# Patient Record
Sex: Male | Born: 1995
Health system: Southern US, Community
[De-identification: ages and names within clinical notes are randomized; demographics above are authoritative.]

---
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
## 2016-01-21 MED FILL — VENTOLIN HFA 90 MCG INHALER: 108 (90 BAS | 25 days supply | Qty: 18 | Fill #1

## 2016-02-09 MED FILL — ADVAIR 250/50 DISKUS: 250-50 | 90 days supply | Qty: 180 | Fill #2

## 2016-02-29 MED FILL — VENTOLIN HFA 90 MCG INHALER: 108 (90 BAS | 25 days supply | Qty: 18 | Fill #2

## 2016-06-09 DIAGNOSIS — R509 Fever, unspecified: Secondary | ICD-10-CM | POA: Diagnosis not present

## 2016-06-09 DIAGNOSIS — K5900 Constipation, unspecified: Secondary | ICD-10-CM | POA: Diagnosis not present

## 2016-06-22 MED FILL — VENTOLIN HFA 90 MCG INHALER: 108 (90 BAS | 25 days supply | Qty: 18 | Fill #0

## 2016-06-30 ENCOUNTER — Encounter (HOSPITAL_COMMUNITY): Payer: Self-pay | Admitting: Emergency Medicine

## 2016-06-30 ENCOUNTER — Ambulatory Visit (HOSPITAL_COMMUNITY)
Admission: EM | Admit: 2016-06-30 | Discharge: 2016-06-30 | Disposition: A | Discharge status: Home / Self Care | Payer: 59 | Attending: Family Medicine | Admitting: Family Medicine

## 2016-06-30 DIAGNOSIS — Z202 Contact with and (suspected) exposure to infections with a predominantly sexual mode of transmission: Secondary | ICD-10-CM | POA: Insufficient documentation

## 2016-06-30 DIAGNOSIS — N342 Other urethritis: Secondary | ICD-10-CM | POA: Diagnosis not present

## 2016-06-30 MED ORDER — AZITHROMYCIN 250 MG PO TABS
ORAL_TABLET | ORAL | Status: AC
  Filled 2016-06-30: qty 4

## 2016-06-30 MED ORDER — ACETAMINOPHEN 160 MG/5ML PO SUSP
ORAL | Status: AC
  Filled 2016-06-30: qty 5

## 2016-06-30 MED ORDER — CEFTRIAXONE SODIUM 250 MG IJ SOLR
INTRAMUSCULAR | Status: AC
  Filled 2016-06-30: qty 250

## 2016-06-30 MED ORDER — AZITHROMYCIN 250 MG PO TABS
1000.0000 mg | ORAL_TABLET | Freq: Once | ORAL | Status: AC
  Administered 2016-06-30: 1000 mg via ORAL

## 2016-06-30 MED ORDER — CEFTRIAXONE SODIUM 250 MG IJ SOLR
250.0000 mg | Freq: Once | INTRAMUSCULAR | Status: AC
  Administered 2016-06-30: 250 mg via INTRAMUSCULAR

## 2016-06-30 NOTE — ED Triage Notes (Signed)
Here for STD check... Had unprotected sex 1 week  Sx today include dysuria  Denies penile d/c  Also reports watery stools onset 5 days.   A&O x4... NAD

## 2016-06-30 NOTE — ED Provider Notes (Signed)
CSN: [id number]     Arrival date & time 06/30/16  1739 History   First MD Initiated Contact with Patient 06/30/16 1958     Chief Complaint  Patient presents with  . Exposure to STD   (Consider location/radiation/quality/duration/timing/severity/associated sxs/prior Treatment) Patient c/o penile discharge and has had recently had unprotected sex a week ago.   The history is provided by the patient.  Exposure to STD  This is a new problem. The current episode started more than 1 week ago. The problem occurs constantly. The problem has not changed since onset.The symptoms are aggravated by intercourse. He has tried nothing for the symptoms.    Past Medical History:  Diagnosis Date  . Asthma    History reviewed. No pertinent surgical history. History reviewed. No pertinent family history. Social History  Substance Use Topics  . Smoking status: Never Smoker  . Smokeless tobacco: Never Used  . Alcohol use No    Review of Systems  Constitutional: Negative.   HENT: Negative.   Eyes: Negative.   Respiratory: Negative.   Cardiovascular: Negative.   Gastrointestinal: Negative.   Endocrine: Negative.   Genitourinary: Positive for dysuria and penile pain.  Musculoskeletal: Negative.   Allergic/Immunologic: Negative.   Neurological: Negative.   Hematological: Negative.   Psychiatric/Behavioral: Negative.     Allergies  Patient has no known allergies.  Home Medications   Prior to Admission medications   Medication Sig Start Date End Date Taking? Authorizing Provider  fluticasone-salmeterol (ADVAIR HFA) 115-21 MCG/ACT inhaler Inhale 2 puffs into the lungs 2 (two) times daily.     Yes Historical Provider, MD   Meds Ordered and Administered this Visit   Medications  cefTRIAXone (ROCEPHIN) injection 250 mg (not administered)  azithromycin (ZITHROMAX) tablet 1,000 mg (1,000 mg Oral Given 06/30/16 2032)    BP 146/82 (BP Location: Left Arm)   Pulse 67   Temp 98.2 F (36.8 C)  (Oral)   Resp 16   SpO2 100%  No data found.   Physical Exam  Constitutional: He is oriented to person, place, and time. He appears well-developed and well-nourished.  HENT:  Head: Normocephalic and atraumatic.  Eyes: Conjunctivae and EOM are normal. Pupils are equal, round, and reactive to light.  Neck: Normal range of motion. Neck supple.  Pulmonary/Chest: Effort normal and breath sounds normal.  Abdominal: Soft. Bowel sounds are normal.  Genitourinary: Penile tenderness present.  Neurological: He is alert and oriented to person, place, and time.  Nursing note and vitals reviewed.   Urgent Care Course     Procedures (including critical care time)  Labs Review Labs Reviewed  URINE CYTOLOGY ANCILLARY ONLY    Imaging Review No results found.   Visual Acuity Review  Right Eye Distance:   Left Eye Distance:   Bilateral Distance:    Right Eye Near:   Left Eye Near:    Bilateral Near:         MDM   1. STD exposure   2. Possible exposure to STD   3. Urethritis    Azithromycin 250mg  x 4 Rocephin 250mg  IM  Urine Cytology GC chlamydia [place] Artistrich      Katriel Cutsforth J Keisuke Hollabaugh, FNP 06/30/16 2037

## 2016-07-03 LAB — URINE CYTOLOGY ANCILLARY ONLY
Chlamydia: NEGATIVE
Neisseria Gonorrhea: NEGATIVE
Trichomonas: NEGATIVE

## 2016-07-27 MED FILL — VENTOLIN HFA 90 MCG INHALER: 108 (90 BAS | 25 days supply | Qty: 18 | Fill #0

## 2016-09-22 MED FILL — VENTOLIN HFA 90 MCG INHALER: 108 (90 BAS | 25 days supply | Qty: 18 | Fill #1

## 2016-10-04 DIAGNOSIS — M533 Sacrococcygeal disorders, not elsewhere classified: Secondary | ICD-10-CM | POA: Diagnosis not present

## 2016-10-23 MED FILL — ADVAIR 250/50 DISKUS: 250-50 | 90 days supply | Qty: 180 | Fill #0

## 2016-10-23 MED FILL — VENTOLIN HFA 90 MCG INHALER: 108 (90 BAS | 17 days supply | Qty: 18 | Fill #0

## 2016-10-30 ENCOUNTER — Encounter (HOSPITAL_COMMUNITY): Payer: Self-pay | Admitting: Family Medicine

## 2016-10-30 ENCOUNTER — Ambulatory Visit (HOSPITAL_COMMUNITY)
Admission: EM | Admit: 2016-10-30 | Discharge: 2016-10-30 | Disposition: A | Discharge status: Home / Self Care | Payer: 59 | Attending: Family Medicine | Admitting: Family Medicine

## 2016-10-30 ENCOUNTER — Telehealth (HOSPITAL_COMMUNITY): Payer: Self-pay | Admitting: Emergency Medicine

## 2016-10-30 DIAGNOSIS — R05 Cough: Secondary | ICD-10-CM | POA: Diagnosis not present

## 2016-10-30 DIAGNOSIS — R062 Wheezing: Secondary | ICD-10-CM | POA: Diagnosis not present

## 2016-10-30 DIAGNOSIS — J4541 Moderate persistent asthma with (acute) exacerbation: Secondary | ICD-10-CM

## 2016-10-30 MED ORDER — PREDNISONE 20 MG PO TABS: ORAL_TABLET | ORAL | 0 refills | Status: AC

## 2016-10-30 MED ORDER — PREDNISONE 20 MG PO TABS: ORAL_TABLET | ORAL | 0 refills | Status: DC

## 2016-10-30 MED ORDER — PREDNISONE 20 MG PO TABS
ORAL_TABLET | ORAL | Status: AC
  Filled 2016-10-30: qty 3

## 2016-10-30 MED ORDER — PREDNISONE 20 MG PO TABS
20.0000 mg | ORAL_TABLET | Freq: Once | ORAL | Status: AC
  Administered 2016-10-30: 20 mg via ORAL

## 2016-10-30 MED ORDER — IPRATROPIUM-ALBUTEROL 0.5-2.5 (3) MG/3ML IN SOLN
3.0000 mL | Freq: Once | RESPIRATORY_TRACT | Status: AC
  Administered 2016-10-30: 3 mL via RESPIRATORY_TRACT

## 2016-10-30 MED ORDER — IPRATROPIUM-ALBUTEROL 0.5-2.5 (3) MG/3ML IN SOLN
RESPIRATORY_TRACT | Status: AC
  Filled 2016-10-30: qty 3

## 2016-10-30 MED FILL — predniSONE 20 MG TABS: 20 | 5 days supply | Qty: 10 | Fill #0

## 2016-10-30 NOTE — Telephone Encounter (Signed)
Patient [facility].

## 2016-10-30 NOTE — ED Triage Notes (Signed)
Pt  Has  Some  Wheezing   Cough    And  Congestion          With  Some   Shortness  Of  Breath       Not  releived  By  Her  otc  Inhalers  And  Neb  Treatment

## 2016-10-30 NOTE — Discharge Instructions (Signed)
You should be feeling much better by this evening. If your symptoms are getting worse again this evening, please come to the emergency room for follow-up

## 2016-10-30 NOTE — ED Provider Notes (Signed)
[facility]    CSN: [id number] Arrival date & time: 10/30/16  1432     History   Chief Complaint Chief Complaint  Patient presents with  . Cough    HPI Garrett Taylor is a 21 y.o. male.   Pt  Has  Some  Wheezing   Cough    And  Congestion          With  Some   Shortness  Of  Breath       Not  releived  By  Her  otc  Inhalers  And  Neb  Treatment          Past Medical History:  Diagnosis Date  . Asthma     There are no active problems to display for this patient.   History reviewed. No pertinent surgical history.     Home Medications    Prior to Admission medications   Medication Sig Start Date End Date Taking? Authorizing Provider  fluticasone-salmeterol (ADVAIR HFA) 115-21 MCG/ACT inhaler Inhale 2 puffs into the lungs 2 (two) times daily.      [provider]  predniSONE (DELTASONE) 20 MG tablet Two daily with food 10/30/16   Elvina SidleLauenstein, Adalyna Godbee, MD    Family History No family history on file.  Social History Social History  Substance Use Topics  . Smoking status: Never Smoker  . Smokeless tobacco: Never Used  . Alcohol use No     Allergies   Patient has no known allergies.   Review of Systems Review of Systems   Physical Exam Triage Vital Signs ED Triage Vitals  Enc Vitals Group     BP      Pulse      Resp      Temp      Temp src      SpO2      Weight      Height      Head Circumference      Peak Flow      Pain Score      Pain Loc      Pain Edu?      Excl. in GC?    No data found.   Updated Vital Signs BP 132/74   Pulse (!) 114   Temp 98.6 F (37 C) (Oral)   Resp 20   SpO2 97%    Physical Exam  Constitutional: He is oriented to person, place, and time. He appears well-developed and well-nourished.  HENT:  Right Ear: External ear normal.  Left Ear: External ear normal.  Mouth/Throat: Oropharynx is clear and moist.  Eyes: Conjunctivae and EOM are normal. Pupils are equal, round, and reactive to  light.  Neck: Normal range of motion. Neck supple.  Cardiovascular: Normal rate, regular rhythm and normal heart sounds.   Pulmonary/Chest: He has wheezes.  Prolonged exhalation phase  Musculoskeletal: Normal range of motion.  Neurological: He is alert and oriented to person, place, and time.  Skin: Skin is warm and dry.  Nursing note and vitals reviewed.    UC Treatments / Results  Labs (all labs ordered are listed, but only abnormal results are displayed) Labs Reviewed - No data to display  EKG  EKG Interpretation None       Radiology No results found.  Procedures Procedures (including critical care time)  Medications Ordered in UC Medications  ipratropium-albuterol (DUONEB) 0.5-2.5 (3) MG/3ML nebulizer solution 3 mL (3 mLs Nebulization Given 10/30/16 1556)  predniSONE (DELTASONE) tablet 20 mg (20  mg Oral Given 10/30/16 1554)     Initial Impression / Assessment and Plan / UC Course  I have reviewed the triage vital signs and the nursing notes.  Pertinent labs & imaging results that were available during my care of the patient were reviewed by me and considered in my medical decision making (see chart for details).   improved aeration after breathing treatment  Final Clinical Impressions(s) / UC Diagnoses   Final diagnoses:  Moderate persistent asthma with exacerbation    New Prescriptions New Prescriptions   PREDNISONE (DELTASONE) 20 MG TABLET    Two daily with food     Elvina Sidle, MD 10/30/16 [phone]

## 2016-11-17 MED FILL — VENTOLIN HFA 90 MCG INHALER: 108 (90 BAS | 25 days supply | Qty: 18 | Fill #2

## 2016-11-20 DIAGNOSIS — M25511 Pain in right shoulder: Secondary | ICD-10-CM | POA: Diagnosis not present

## 2017-02-23 MED FILL — VENTOLIN HFA 90 MCG INHALER: 108 (90 BAS | 25 days supply | Qty: 18 | Fill #1

## 2017-04-12 MED FILL — ADVAIR 250/50 DISKUS: 250-50 | 30 days supply | Qty: 60 | Fill #1

## 2017-04-12 MED FILL — VENTOLIN HFA 90 MCG INHALER: 108 (90 BAS | 25 days supply | Qty: 18 | Fill #2

## 2017-06-06 MED FILL — VENTOLIN HFA 90 MCG INHALER: 108 (90 BAS | 25 days supply | Qty: 18 | Fill #1

## 2017-06-15 MED FILL — ADVAIR 250/50 DISKUS: 250-50 | 30 days supply | Qty: 60 | Fill #2

## 2017-08-10 MED FILL — ADVAIR 250/50 DISKUS: 250-50 | 60 days supply | Qty: 120 | Fill #3

## 2017-08-10 MED FILL — VENTOLIN HFA 90 MCG INHALER: 108 (90 BAS | 25 days supply | Qty: 18 | Fill #0

## 2017-08-30 ENCOUNTER — Ambulatory Visit: Payer: Self-pay | Admitting: Podiatry

## 2017-10-01 MED FILL — VENTOLIN HFA 90 MCG INHALER: 108 (90 BAS | 25 days supply | Qty: 18 | Fill #1

## 2017-11-20 MED FILL — VENTOLIN HFA 90 MCG INHALER: 108 (90 BAS | 25 days supply | Qty: 18 | Fill #2

## 2017-12-20 MED FILL — VENTOLIN HFA 90 MCG INHALER: 108 (90 BAS | 16 days supply | Qty: 18 | Fill #0

## 2017-12-25 MED FILL — ADVAIR 250/50 DISKUS: 250-50 | 90 days supply | Qty: 180 | Fill #0

## 2018-01-18 MED FILL — VENTOLIN HFA 90 MCG INHALER: 108 (90 BAS | 16 days supply | Qty: 18 | Fill #1

## 2018-02-25 MED FILL — VENTOLIN HFA 90 MCG INHALER: 108 (90 BAS | 17 days supply | Qty: 18 | Fill #0

## 2018-07-01 DIAGNOSIS — J45909 Unspecified asthma, uncomplicated: Secondary | ICD-10-CM | POA: Diagnosis not present

## 2018-07-01 MED FILL — VENTOLIN HFA 90 MCG INHALER: 108 (90 BAS | 17 days supply | Qty: 18 | Fill #0

## 2018-07-01 MED FILL — ADVAIR 250/50 DISKUS: 250-50 | 90 days supply | Qty: 180 | Fill #1

## 2018-08-02 MED FILL — VENTOLIN HFA 90 MCG INHALER: 108 (90 BAS | 17 days supply | Qty: 18 | Fill #0

## 2018-08-16 DIAGNOSIS — Z23 Encounter for immunization: Secondary | ICD-10-CM | POA: Diagnosis not present

## 2018-08-16 DIAGNOSIS — Z0000 Encounter for general adult medical examination without abnormal findings: Secondary | ICD-10-CM | POA: Diagnosis not present

## 2018-10-17 ENCOUNTER — Emergency Department (HOSPITAL_COMMUNITY): Payer: 59

## 2018-10-17 ENCOUNTER — Emergency Department (HOSPITAL_COMMUNITY)
Admission: EM | Admit: 2018-10-17 | Discharge: 2018-10-17 | Disposition: A | Discharge status: Home / Self Care | Payer: 59 | Attending: Emergency Medicine | Admitting: Emergency Medicine

## 2018-10-17 ENCOUNTER — Encounter (HOSPITAL_COMMUNITY): Payer: Self-pay | Admitting: Emergency Medicine

## 2018-10-17 ENCOUNTER — Other Ambulatory Visit: Payer: Self-pay

## 2018-10-17 DIAGNOSIS — F10920 Alcohol use, unspecified with intoxication, uncomplicated: Secondary | ICD-10-CM

## 2018-10-17 DIAGNOSIS — Y9389 Activity, other specified: Secondary | ICD-10-CM | POA: Diagnosis not present

## 2018-10-17 DIAGNOSIS — Y999 Unspecified external cause status: Secondary | ICD-10-CM | POA: Insufficient documentation

## 2018-10-17 DIAGNOSIS — Z041 Encounter for examination and observation following transport accident: Secondary | ICD-10-CM | POA: Diagnosis not present

## 2018-10-17 DIAGNOSIS — J45909 Unspecified asthma, uncomplicated: Secondary | ICD-10-CM | POA: Diagnosis not present

## 2018-10-17 DIAGNOSIS — F10129 Alcohol abuse with intoxication, unspecified: Secondary | ICD-10-CM | POA: Diagnosis not present

## 2018-10-17 DIAGNOSIS — S199XXA Unspecified injury of neck, initial encounter: Secondary | ICD-10-CM | POA: Diagnosis not present

## 2018-10-17 DIAGNOSIS — Z79899 Other long term (current) drug therapy: Secondary | ICD-10-CM | POA: Diagnosis not present

## 2018-10-17 DIAGNOSIS — S91311A Laceration without foreign body, right foot, initial encounter: Secondary | ICD-10-CM

## 2018-10-17 DIAGNOSIS — S299XXA Unspecified injury of thorax, initial encounter: Secondary | ICD-10-CM | POA: Diagnosis not present

## 2018-10-17 DIAGNOSIS — F10929 Alcohol use, unspecified with intoxication, unspecified: Secondary | ICD-10-CM | POA: Diagnosis not present

## 2018-10-17 DIAGNOSIS — S61412A Laceration without foreign body of left hand, initial encounter: Secondary | ICD-10-CM | POA: Diagnosis not present

## 2018-10-17 DIAGNOSIS — S0990XA Unspecified injury of head, initial encounter: Secondary | ICD-10-CM | POA: Diagnosis not present

## 2018-10-17 DIAGNOSIS — S3991XA Unspecified injury of abdomen, initial encounter: Secondary | ICD-10-CM | POA: Diagnosis not present

## 2018-10-17 DIAGNOSIS — R0902 Hypoxemia: Secondary | ICD-10-CM | POA: Diagnosis not present

## 2018-10-17 DIAGNOSIS — S96921A Laceration of unspecified muscle and tendon at ankle and foot level, right foot, initial encounter: Secondary | ICD-10-CM | POA: Diagnosis not present

## 2018-10-17 DIAGNOSIS — S61012A Laceration without foreign body of left thumb without damage to nail, initial encounter: Secondary | ICD-10-CM | POA: Diagnosis not present

## 2018-10-17 DIAGNOSIS — Y92410 Unspecified street and highway as the place of occurrence of the external cause: Secondary | ICD-10-CM | POA: Diagnosis not present

## 2018-10-17 DIAGNOSIS — R404 Transient alteration of awareness: Secondary | ICD-10-CM | POA: Diagnosis not present

## 2018-10-17 DIAGNOSIS — S99921A Unspecified injury of right foot, initial encounter: Secondary | ICD-10-CM | POA: Diagnosis not present

## 2018-10-17 DIAGNOSIS — I959 Hypotension, unspecified: Secondary | ICD-10-CM | POA: Diagnosis not present

## 2018-10-17 DIAGNOSIS — I10 Essential (primary) hypertension: Secondary | ICD-10-CM | POA: Diagnosis not present

## 2018-10-17 LAB — CBC WITH DIFFERENTIAL/PLATELET
Abs Immature Granulocytes: 0.07 10*3/uL (ref 0.00–0.07)
Basophils Absolute: 0 10*3/uL (ref 0.0–0.1)
Basophils Relative: 1 %
Eosinophils Absolute: 0.2 10*3/uL (ref 0.0–0.5)
Eosinophils Relative: 4 %
HCT: 48.9 % (ref 39.0–52.0)
Hemoglobin: 16.3 g/dL (ref 13.0–17.0)
Immature Granulocytes: 1 %
Lymphocytes Relative: 32 %
Lymphs Abs: 1.8 10*3/uL (ref 0.7–4.0)
MCH: 31.8 pg (ref 26.0–34.0)
MCHC: 33.3 g/dL (ref 30.0–36.0)
MCV: 95.3 fL (ref 80.0–100.0)
Monocytes Absolute: 0.5 10*3/uL (ref 0.1–1.0)
Monocytes Relative: 9 %
Neutro Abs: 3.2 10*3/uL (ref 1.7–7.7)
Neutrophils Relative %: 53 %
Platelets: 236 10*3/uL (ref 150–400)
RBC: 5.13 MIL/uL (ref 4.22–5.81)
RDW: 11.4 % — ABNORMAL LOW (ref 11.5–15.5)
WBC: 5.9 10*3/uL (ref 4.0–10.5)
nRBC: 0 % (ref 0.0–0.2)

## 2018-10-17 LAB — URINALYSIS, ROUTINE W REFLEX MICROSCOPIC
Bilirubin Urine: NEGATIVE
Glucose, UA: NEGATIVE mg/dL
Ketones, ur: NEGATIVE mg/dL
Leukocytes,Ua: NEGATIVE
Nitrite: NEGATIVE
Protein, ur: NEGATIVE mg/dL
Specific Gravity, Urine: 1.003 — ABNORMAL LOW (ref 1.005–1.030)
pH: 6 (ref 5.0–8.0)

## 2018-10-17 LAB — RAPID URINE DRUG SCREEN, HOSP PERFORMED
Amphetamines: NOT DETECTED
Barbiturates: NOT DETECTED
Benzodiazepines: POSITIVE — AB
Cocaine: NOT DETECTED
Opiates: NOT DETECTED
Tetrahydrocannabinol: NOT DETECTED

## 2018-10-17 LAB — BASIC METABOLIC PANEL
Anion gap: 12 (ref 5–15)
BUN: 10 mg/dL (ref 6–20)
CO2: 21 mmol/L — ABNORMAL LOW (ref 22–32)
Calcium: 9.4 mg/dL (ref 8.9–10.3)
Chloride: 108 mmol/L (ref 98–111)
Creatinine, Ser: 1.15 mg/dL (ref 0.61–1.24)
GFR calc Af Amer: >60 mL/min (ref >60)
GFR calc non Af Amer: >60 mL/min (ref >60)
Glucose, Bld: 93 mg/dL (ref 70–99)
Potassium: 5.1 mmol/L (ref 3.5–5.1)
Sodium: 141 mmol/L (ref 135–145)

## 2018-10-17 LAB — ETHANOL: Alcohol, Ethyl (B): 198 mg/dL — ABNORMAL HIGH (ref <10)

## 2018-10-17 MED ORDER — IOHEXOL 300 MG/ML  SOLN
100.0000 mL | Freq: Once | INTRAMUSCULAR | Status: AC | PRN
  Administered 2018-10-17: 100 mL via INTRAVENOUS

## 2018-10-17 MED ORDER — CEPHALEXIN 500 MG PO CAPS: 500.0000 mg | ORAL_CAPSULE | Freq: Four times a day (QID) | ORAL | 0 refills | Status: AC

## 2018-10-17 MED ORDER — ONDANSETRON HCL 4 MG/2ML IJ SOLN
4.0000 mg | Freq: Once | INTRAMUSCULAR | Status: AC
  Administered 2018-10-17: 17:00:00 4 mg via INTRAVENOUS
  Filled 2018-10-17: qty 2

## 2018-10-17 MED ORDER — LIDOCAINE HCL (PF) 1 % IJ SOLN
30.0000 mL | Freq: Once | INTRAMUSCULAR | Status: AC
  Administered 2018-10-17: 30 mL
  Filled 2018-10-17: qty 30

## 2018-10-17 MED ORDER — SODIUM CHLORIDE 0.9 % IV BOLUS
1000.0000 mL | Freq: Once | INTRAVENOUS | Status: AC
  Administered 2018-10-17: 1000 mL via INTRAVENOUS

## 2018-10-17 NOTE — ED Notes (Signed)
Patient transported to X-ray 
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
## 2018-10-17 NOTE — ED Notes (Signed)
Pts mother called and given an update over the phone on his condition. Pt stable in XRAY and CT currently. Mother states she will call back for an update in an hour.
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
## 2018-10-17 NOTE — ED Notes (Signed)
Pt also fitted with post op shoe

## 2018-10-17 NOTE — ED Notes (Signed)
Pt back from xray they states he vomited x 2

## 2018-10-17 NOTE — ED Notes (Signed)
Mom called and given all d/c instructions over the phone , rt foot wound dressed and  And extra bandages given placed in bag  For family
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
## 2018-10-17 NOTE — ED Notes (Signed)
Pt mom Lynden Oxford would like an update [phone]

## 2018-10-17 NOTE — Progress Notes (Signed)
Orthopedic Tech Progress Note Patient Details:  Garrett Taylor 1996/06/03 [id number]  Ortho Devices Type of Ortho Device: Prafo boot/shoe, Crutches Ortho Device/Splint Location: right Ortho Device/Splint Interventions: Application   Post Interventions Patient Tolerated: Well Instructions Provided: Care of device   Saul Fordyce 10/17/2018, 6:16 PM
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
## 2018-10-23 MED FILL — VENTOLIN HFA 90 MCG INHALER: 108 (90 BAS | 17 days supply | Qty: 18 | Fill #0

## 2018-10-23 MED FILL — ADVAIR 250/50 DISKUS: 250-50 | 90 days supply | Qty: 180 | Fill #0

## 2018-10-24 ENCOUNTER — Ambulatory Visit (HOSPITAL_COMMUNITY)
Admission: EM | Admit: 2018-10-24 | Discharge: 2018-10-24 | Disposition: A | Discharge status: Home / Self Care | Payer: 59 | Attending: Internal Medicine | Admitting: Internal Medicine

## 2018-10-24 ENCOUNTER — Encounter (HOSPITAL_COMMUNITY): Payer: Self-pay

## 2018-10-24 ENCOUNTER — Other Ambulatory Visit: Payer: Self-pay

## 2018-10-24 DIAGNOSIS — Z4802 Encounter for removal of sutures: Secondary | ICD-10-CM

## 2018-10-24 NOTE — ED Notes (Signed)
Sutures removed by queenie, cma.   One spot on foot wound was oozing.  This nurse asked dr hagler to evaluate wound and answer patient's questions

## 2018-10-24 NOTE — ED Triage Notes (Signed)
Pt came in to have suture removed.

## 2018-10-24 NOTE — ED Notes (Addendum)
Staff removed two suture from the patient Left hand on the side of his pinky finger,  removed  6 suture on the bottom of his right foot.  Patient tolerated well. Staff cleaned his foot and wrap with gauzes and a [place]  wrap.

## 2018-11-07 ENCOUNTER — Other Ambulatory Visit: Payer: Self-pay

## 2018-11-07 ENCOUNTER — Encounter (HOSPITAL_COMMUNITY): Payer: Self-pay

## 2018-11-07 ENCOUNTER — Emergency Department (HOSPITAL_COMMUNITY): Payer: 59

## 2018-11-07 ENCOUNTER — Emergency Department (HOSPITAL_COMMUNITY)
Admission: EM | Admit: 2018-11-07 | Discharge: 2018-11-07 | Disposition: A | Discharge status: Home / Self Care | Payer: 59 | Attending: Emergency Medicine | Admitting: Emergency Medicine

## 2018-11-07 DIAGNOSIS — R0602 Shortness of breath: Secondary | ICD-10-CM | POA: Insufficient documentation

## 2018-11-07 DIAGNOSIS — J45909 Unspecified asthma, uncomplicated: Secondary | ICD-10-CM | POA: Insufficient documentation

## 2018-11-07 LAB — BASIC METABOLIC PANEL
Anion gap: 11 (ref 5–15)
BUN: 8 mg/dL (ref 6–20)
CO2: 23 mmol/L (ref 22–32)
Calcium: 9.9 mg/dL (ref 8.9–10.3)
Chloride: 103 mmol/L (ref 98–111)
Creatinine, Ser: 1.34 mg/dL — ABNORMAL HIGH (ref 0.61–1.24)
GFR calc Af Amer: >60 mL/min (ref >60)
GFR calc non Af Amer: >60 mL/min (ref >60)
Glucose, Bld: 109 mg/dL — ABNORMAL HIGH (ref 70–99)
Potassium: 3.9 mmol/L (ref 3.5–5.1)
Sodium: 137 mmol/L (ref 135–145)

## 2018-11-07 LAB — CBC WITH DIFFERENTIAL/PLATELET
Abs Immature Granulocytes: 0.04 10*3/uL (ref 0.00–0.07)
Basophils Absolute: 0 10*3/uL (ref 0.0–0.1)
Basophils Relative: 0 %
Eosinophils Absolute: 0 10*3/uL (ref 0.0–0.5)
Eosinophils Relative: 0 %
HCT: 41.9 % (ref 39.0–52.0)
Hemoglobin: 14.4 g/dL (ref 13.0–17.0)
Immature Granulocytes: 0 %
Lymphocytes Relative: 12 %
Lymphs Abs: 1.2 10*3/uL (ref 0.7–4.0)
MCH: 31.6 pg (ref 26.0–34.0)
MCHC: 34.4 g/dL (ref 30.0–36.0)
MCV: 91.9 fL (ref 80.0–100.0)
Monocytes Absolute: 0.9 10*3/uL (ref 0.1–1.0)
Monocytes Relative: 10 %
Neutro Abs: 7.4 10*3/uL (ref 1.7–7.7)
Neutrophils Relative %: 78 %
Platelets: 290 10*3/uL (ref 150–400)
RBC: 4.56 MIL/uL (ref 4.22–5.81)
RDW: 11.3 % — ABNORMAL LOW (ref 11.5–15.5)
WBC: 9.7 10*3/uL (ref 4.0–10.5)
nRBC: 0 % (ref 0.0–0.2)

## 2018-11-07 LAB — D-DIMER, QUANTITATIVE: D-Dimer, Quant: 0.29 ug/mL-FEU (ref 0.00–0.50)

## 2018-11-07 LAB — BRAIN NATRIURETIC PEPTIDE: B Natriuretic Peptide: 7.8 pg/mL (ref 0.0–100.0)

## 2018-11-07 NOTE — ED Notes (Signed)
Nurse navigator received call from pt's mother requesting update on pt. She will pick pt up once he is discharged.

## 2018-11-07 NOTE — ED Provider Notes (Signed)
[facility] Provider Note   CSN: [id number] Arrival date & time: 11/07/18  1222    History   Chief Complaint Chief Complaint  Patient presents with  . Shortness of Breath    HPI [facility] is a 23 y.o. male.     The history is provided by the patient. No language interpreter was used.  Shortness of Breath     23 year old male with history of asthma brought here via EMS for evaluation of shortness of breath.  Patient states he was painting again for approximately an hour when he developed acute onset of shortness of breath.  States that it felt that he has to take a deep breath to catch his breath.  Symptoms moderate in severity.  No associated fever chills no lightheadedness dizziness productive cough hemoptysis nausea vomiting chest pain or abdominal pain.  States that his asthma is very well controlled.  He denies any prior history of PE or DVT.  He denies any recent surgery, prolonged bedrest, recent travel.  He denies any wheezing.  No exertional chest pain.  Past Medical History:  Diagnosis Date  . Asthma     There are no active problems to display for this patient.   History reviewed. No pertinent surgical history.      Home Medications    Prior to Admission medications   Medication Sig Start Date End Date Taking? Authorizing Provider  cephALEXin (KEFLEX) 500 MG capsule Take 1 capsule (500 mg total) by mouth 4 (four) times daily. 10/17/18   Tanda Rockers, PA-C  fluticasone-salmeterol (ADVAIR HFA) 110-31 MCG/ACT inhaler Inhale 2 puffs into the lungs 2 (two) times daily.      [provider]  predniSONE (DELTASONE) 20 MG tablet Two daily with food 10/30/16   Elvina Sidle, MD    Family History No family history on file.  Social History Social History   Tobacco Use  . Smoking status: Never Smoker  . Smokeless tobacco: Never Used  Substance Use Topics  . Alcohol use: No  . Drug use: No      Allergies   Patient has no known allergies.   Review of Systems Review of Systems  Respiratory: Positive for shortness of breath.   All other systems reviewed and are negative.    Physical Exam Updated Vital Signs BP 133/80   Pulse 92   Temp 99.2 F (37.3 C) (Oral)   Resp 15   Ht 5\' 4"  (1.626 m)   Wt 60.3 kg   SpO2 100%   BMI 22.82 kg/m   Physical Exam Vitals signs and nursing note reviewed.  Constitutional:      General: He is not in acute distress.    Appearance: He is well-developed.  HENT:     Head: Atraumatic.     Mouth/Throat:     Mouth: Mucous membranes are moist.  Eyes:     Conjunctiva/sclera: Conjunctivae normal.  Neck:     Musculoskeletal: Normal range of motion and neck supple.     Thyroid: No thyromegaly.     Vascular: No JVD.  Cardiovascular:     Rate and Rhythm: Tachycardia present.  Pulmonary:     Effort: Pulmonary effort is normal.     Breath sounds: Normal breath sounds. No decreased breath sounds, wheezing, rhonchi or rales.  Chest:     Chest wall: No tenderness.  Abdominal:     Palpations: Abdomen is soft.     Tenderness: There is no abdominal tenderness.  Musculoskeletal:     Right lower leg: No edema.     Left lower leg: No edema.  Lymphadenopathy:     Cervical: No cervical adenopathy.  Skin:    Capillary Refill: Capillary refill takes less than 2 seconds.     Findings: No rash.  Neurological:     Mental Status: He is alert and oriented to person, place, and time.      ED Treatments / Results  Labs (all labs ordered are listed, but only abnormal results are displayed) Labs Reviewed  BASIC METABOLIC PANEL - Abnormal; Notable for the following components:      Result Value   Glucose, Bld 109 (*)    Creatinine, Ser 1.34 (*)    All other components within normal limits  CBC WITH DIFFERENTIAL/PLATELET - Abnormal; Notable for the following components:   RDW 11.3 (*)    All other components within normal limits  BRAIN  NATRIURETIC PEPTIDE  D-DIMER, QUANTITATIVE (NOT AT [facility])    EKG EKG Interpretation  Date/Time:  Thursday Nov 07 2018 12:28:43 EDT Ventricular Rate:  95 PR Interval:    QRS Duration: 100 QT Interval:  343 QTC Calculation: 432 R Axis:   95 Text Interpretation:  Sinus arrhythmia Prolonged PR interval Left atrial enlargement Borderline right axis deviation Confirmed by Geoffery Lyons ([id number]) on 11/07/2018 12:51:17 PM Also confirmed by Geoffery Lyons ([id number]), editor Barbette Hair [phone])  on 11/07/2018 1:01:05 PM   Radiology Dg Chest 2 View  Result Date: 11/07/2018 CLINICAL DATA:  Shortness of breath and tachycardia EXAM: CHEST - 2 VIEW COMPARISON:  Chest CT 10/17/2018 FINDINGS: The cardiac silhouette, mediastinal and hilar contours are within normal limits. The lungs are clear. No pleural effusion. No worrisome pulmonary lesions. The bony thorax is intact. IMPRESSION: No acute cardiopulmonary findings. Electronically Signed   By: Rudie Meyer M.D.   On: 11/07/2018 13:17    Procedures Procedures (including critical care time)  Medications Ordered in ED Medications - No data to display   Initial Impression / Assessment and Plan / ED Course  I have reviewed the triage vital signs and the nursing notes.  Pertinent labs & imaging results that were available during my care of the patient were reviewed by me and considered in my medical decision making (see chart for details).        BP 114/72 (BP Location: Left Arm)   Pulse 91   Temp 99.2 F (37.3 C) (Oral)   Resp 20   Ht  (1.626 m)   Wt 60.3 kg   SpO2 99%   BMI 22.82 kg/m    Final Clinical Impressions(s) / ED Diagnoses   Final diagnoses:  Shortness of breath    ED Discharge Orders    None     1:01 PM Patient report acute onset of shortness of breath without wheezing.  He was found to be mildly tachycardic therefore unable to perform PERC criteria to rule out PE.  Will obtain d-dimer, work-up initiated.  He is  not hypoxic, vital signs stable.  2:57 PM Fortunately d-dimer is negative.  Low suspicion for PE.  Evidence of mild AKI with creatinine of 1.34.  Patient tolerates p.o. normal H&H and normal WBC.  Normal BNP.  Chest x-ray unremarkable.  EKG unremarkable.  No abnormal arrhythmia.  Patient ambulate without any hypoxia.  Reassurance given patient is stable for discharge. Doubt COVID-19  OBRIEN HUSKINS was evaluated in Emergency Department on 11/07/2018 for the symptoms described in the history of  present illness. He was evaluated in the context of the global COVID-19 pandemic, which necessitated consideration that the patient might be at risk for infection with the SARS-CoV-2 virus that causes COVID-19. Institutional protocols and algorithms that pertain to the evaluation of patients at risk for COVID-19 are in a state of rapid change based on information released by regulatory bodies including the CDC and federal and state organizations. These policies and algorithms were followed during the patient's care in the ED.    Fayrene Helperran, Saifullah Jolley, PA-C 11/07/18 1500    Geoffery Lyonselo, [place], MD 11/09/18 [phone]
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
## 2018-11-07 NOTE — ED Notes (Signed)
Patient transported to x-ray. ?

## 2018-11-07 NOTE — ED Notes (Signed)
Pt ambulated with steady gait.  SpO2 maintained above 95%.  He denied shortness of breath.

## 2018-11-07 NOTE — ED Notes (Signed)
This RN acting as nurse navigator and asked pt if he would like for me to call family/friends. Pt declined and states he is able to keep them informed. 

## 2018-12-30 MED FILL — ALBUTEROL SULFATE HFA 108 (: 108 (90 BAS | 16 days supply | Qty: 18 | Fill #0

## 2019-03-10 DIAGNOSIS — L309 Dermatitis, unspecified: Secondary | ICD-10-CM | POA: Diagnosis not present

## 2019-03-10 DIAGNOSIS — R011 Cardiac murmur, unspecified: Secondary | ICD-10-CM | POA: Diagnosis not present

## 2019-03-10 DIAGNOSIS — R451 Restlessness and agitation: Secondary | ICD-10-CM | POA: Diagnosis not present

## 2019-03-10 DIAGNOSIS — R079 Chest pain, unspecified: Secondary | ICD-10-CM | POA: Diagnosis not present

## 2019-03-12 ENCOUNTER — Ambulatory Visit (HOSPITAL_COMMUNITY): Payer: 59 | Attending: Cardiovascular Disease

## 2019-03-12 ENCOUNTER — Other Ambulatory Visit (HOSPITAL_COMMUNITY): Payer: Self-pay

## 2019-03-12 ENCOUNTER — Other Ambulatory Visit: Payer: Self-pay

## 2019-03-12 DIAGNOSIS — R011 Cardiac murmur, unspecified: Secondary | ICD-10-CM | POA: Diagnosis not present

## 2019-03-14 MED FILL — ALBUTEROL SULFATE HFA 108 (: 108 (90 BAS | 16 days supply | Qty: 18 | Fill #1

## 2019-04-14 DIAGNOSIS — Z23 Encounter for immunization: Secondary | ICD-10-CM | POA: Diagnosis not present

## 2019-04-14 DIAGNOSIS — F419 Anxiety disorder, unspecified: Secondary | ICD-10-CM | POA: Diagnosis not present

## 2019-04-14 MED FILL — ESCITALOPRAM 10 MG TABLET: 10 | 30 days supply | Qty: 30 | Fill #0

## 2019-04-15 MED FILL — ETODOLAC 400 MG TABS: 400 | 30 days supply | Qty: 60 | Fill #0

## 2019-05-02 DIAGNOSIS — F419 Anxiety disorder, unspecified: Secondary | ICD-10-CM | POA: Diagnosis not present

## 2019-05-02 DIAGNOSIS — L299 Pruritus, unspecified: Secondary | ICD-10-CM | POA: Diagnosis not present

## 2019-05-02 MED FILL — HYDROXYZINE PAM 25 MG CAP: 25 | 20 days supply | Qty: 40 | Fill #0

## 2019-05-06 MED FILL — ALBUTEROL SULFATE HFA 108 (: 108 (90 BAS | 16 days supply | Qty: 18 | Fill #2

## 2019-06-03 MED FILL — ALBUTEROL SULFATE HFA 108 (: 108 (90 BAS | 17 days supply | Qty: 9 | Fill #0

## 2019-07-04 ENCOUNTER — Other Ambulatory Visit (HOSPITAL_COMMUNITY): Payer: Self-pay | Admitting: Physician Assistant

## 2019-07-04 MED FILL — ADVAIR 250/50 DISKUS: 250-50 | 90 days supply | Qty: 180 | Fill #0

## 2019-07-04 MED FILL — ALBUTEROL SULFATE HFA 108 (: 108 (90 BAS | 17 days supply | Qty: 9 | Fill #1

## 2019-08-19 DIAGNOSIS — Z0000 Encounter for general adult medical examination without abnormal findings: Secondary | ICD-10-CM | POA: Diagnosis not present

## 2019-08-19 DIAGNOSIS — Z136 Encounter for screening for cardiovascular disorders: Secondary | ICD-10-CM | POA: Diagnosis not present

## 2019-08-19 DIAGNOSIS — J45909 Unspecified asthma, uncomplicated: Secondary | ICD-10-CM | POA: Diagnosis not present

## 2019-08-21 DIAGNOSIS — R300 Dysuria: Secondary | ICD-10-CM | POA: Diagnosis not present

## 2019-08-21 DIAGNOSIS — Z113 Encounter for screening for infections with a predominantly sexual mode of transmission: Secondary | ICD-10-CM | POA: Diagnosis not present

## 2019-09-05 MED FILL — ALBUTEROL SULFATE HFA 108 (: 108 (90 BAS | 16 days supply | Qty: 18 | Fill #0

## 2019-09-22 MED FILL — ALBUTEROL SULFATE HFA 108 (: 108 (90 BAS | 16 days supply | Qty: 18 | Fill #1

## 2019-10-09 MED FILL — ALBUTEROL SULFATE HFA 108 (: 108 (90 BAS | 16 days supply | Qty: 18 | Fill #0

## 2019-10-22 DIAGNOSIS — R591 Generalized enlarged lymph nodes: Secondary | ICD-10-CM | POA: Diagnosis not present

## 2019-10-22 DIAGNOSIS — L906 Striae atrophicae: Secondary | ICD-10-CM | POA: Diagnosis not present

## 2019-10-22 DIAGNOSIS — W57XXXA Bitten or stung by nonvenomous insect and other nonvenomous arthropods, initial encounter: Secondary | ICD-10-CM | POA: Diagnosis not present

## 2019-10-22 DIAGNOSIS — S30861A Insect bite (nonvenomous) of abdominal wall, initial encounter: Secondary | ICD-10-CM | POA: Diagnosis not present

## 2019-11-26 MED FILL — ALBUTEROL SULFATE HFA 108 (: 108 (90 BAS | 16 days supply | Qty: 18 | Fill #1

## 2019-11-28 DIAGNOSIS — K625 Hemorrhage of anus and rectum: Secondary | ICD-10-CM | POA: Diagnosis not present

## 2019-11-28 DIAGNOSIS — R109 Unspecified abdominal pain: Secondary | ICD-10-CM | POA: Diagnosis not present

## 2019-11-28 DIAGNOSIS — K649 Unspecified hemorrhoids: Secondary | ICD-10-CM | POA: Diagnosis not present

## 2019-12-18 MED FILL — ADVAIR 250/50 DISKUS: 250-50 | 90 days supply | Qty: 180 | Fill #1

## 2019-12-18 MED FILL — ALBUTEROL SULFATE HFA 108 (: 108 (90 BAS | 16 days supply | Qty: 18 | Fill #0

## 2020-02-26 MED FILL — ALBUTEROL SULFATE HFA 108 (: 108 (90 BAS | 16 days supply | Qty: 18 | Fill #1
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
## 2020-04-26 ENCOUNTER — Other Ambulatory Visit (HOSPITAL_COMMUNITY): Payer: Self-pay | Admitting: Physician Assistant

## 2020-04-26 MED FILL — ALBUTEROL SULFATE HFA 108 (: 108 (90 BAS | 16 days supply | Qty: 18 | Fill #0

## 2020-06-18 MED FILL — ADVAIR 250/50 DISKUS: 250-50 | 30 days supply | Qty: 60 | Fill #0

## 2020-06-18 MED FILL — ALBUTEROL SULFATE HFA 108 (: 108 (90 BAS | 16 days supply | Qty: 18 | Fill #1

## 2020-06-22 MED FILL — ADVAIR 250/50 DISKUS: 250-50 | 30 days supply | Qty: 60 | Fill #0 | Status: TO

## 2020-06-22 MED FILL — ADVAIR 250/50 DISKUS: 250-50 | 30 days supply | Qty: 60 | Fill #0

## 2020-07-26 DIAGNOSIS — F1020 Alcohol dependence, uncomplicated: Secondary | ICD-10-CM | POA: Diagnosis not present

## 2020-08-02 DIAGNOSIS — F1020 Alcohol dependence, uncomplicated: Secondary | ICD-10-CM | POA: Diagnosis not present

## 2020-08-04 DIAGNOSIS — F1020 Alcohol dependence, uncomplicated: Secondary | ICD-10-CM | POA: Diagnosis not present

## 2020-08-06 DIAGNOSIS — F1020 Alcohol dependence, uncomplicated: Secondary | ICD-10-CM | POA: Diagnosis not present

## 2020-08-09 DIAGNOSIS — F1020 Alcohol dependence, uncomplicated: Secondary | ICD-10-CM | POA: Diagnosis not present

## 2020-08-18 DIAGNOSIS — F1020 Alcohol dependence, uncomplicated: Secondary | ICD-10-CM | POA: Diagnosis not present

## 2020-08-20 DIAGNOSIS — F1020 Alcohol dependence, uncomplicated: Secondary | ICD-10-CM | POA: Diagnosis not present

## 2020-08-23 DIAGNOSIS — F1020 Alcohol dependence, uncomplicated: Secondary | ICD-10-CM | POA: Diagnosis not present

## 2020-08-24 ENCOUNTER — Other Ambulatory Visit (HOSPITAL_COMMUNITY): Payer: Self-pay | Admitting: Physician Assistant

## 2020-08-24 DIAGNOSIS — F1020 Alcohol dependence, uncomplicated: Secondary | ICD-10-CM | POA: Diagnosis not present

## 2020-08-25 DIAGNOSIS — F1020 Alcohol dependence, uncomplicated: Secondary | ICD-10-CM | POA: Diagnosis not present

## 2020-08-27 DIAGNOSIS — F1020 Alcohol dependence, uncomplicated: Secondary | ICD-10-CM | POA: Diagnosis not present

## 2020-08-30 DIAGNOSIS — F1020 Alcohol dependence, uncomplicated: Secondary | ICD-10-CM | POA: Diagnosis not present

## 2020-08-31 DIAGNOSIS — F1020 Alcohol dependence, uncomplicated: Secondary | ICD-10-CM | POA: Diagnosis not present

## 2020-09-01 DIAGNOSIS — F1020 Alcohol dependence, uncomplicated: Secondary | ICD-10-CM | POA: Diagnosis not present

## 2020-09-03 DIAGNOSIS — F1020 Alcohol dependence, uncomplicated: Secondary | ICD-10-CM | POA: Diagnosis not present

## 2020-09-06 DIAGNOSIS — F1020 Alcohol dependence, uncomplicated: Secondary | ICD-10-CM | POA: Diagnosis not present

## 2020-09-07 DIAGNOSIS — F1020 Alcohol dependence, uncomplicated: Secondary | ICD-10-CM | POA: Diagnosis not present

## 2020-09-08 DIAGNOSIS — F1020 Alcohol dependence, uncomplicated: Secondary | ICD-10-CM | POA: Diagnosis not present

## 2020-09-10 DIAGNOSIS — F1020 Alcohol dependence, uncomplicated: Secondary | ICD-10-CM | POA: Diagnosis not present

## 2020-09-13 DIAGNOSIS — F1020 Alcohol dependence, uncomplicated: Secondary | ICD-10-CM | POA: Diagnosis not present

## 2020-09-14 DIAGNOSIS — F1020 Alcohol dependence, uncomplicated: Secondary | ICD-10-CM | POA: Diagnosis not present

## 2020-09-15 DIAGNOSIS — F1020 Alcohol dependence, uncomplicated: Secondary | ICD-10-CM | POA: Diagnosis not present

## 2020-09-17 DIAGNOSIS — F1020 Alcohol dependence, uncomplicated: Secondary | ICD-10-CM | POA: Diagnosis not present

## 2020-09-20 DIAGNOSIS — F1020 Alcohol dependence, uncomplicated: Secondary | ICD-10-CM | POA: Diagnosis not present

## 2020-09-21 DIAGNOSIS — F1020 Alcohol dependence, uncomplicated: Secondary | ICD-10-CM | POA: Diagnosis not present

## 2020-09-22 DIAGNOSIS — F1020 Alcohol dependence, uncomplicated: Secondary | ICD-10-CM | POA: Diagnosis not present

## 2020-09-24 DIAGNOSIS — F1020 Alcohol dependence, uncomplicated: Secondary | ICD-10-CM | POA: Diagnosis not present

## 2020-09-27 DIAGNOSIS — F1020 Alcohol dependence, uncomplicated: Secondary | ICD-10-CM | POA: Diagnosis not present

## 2020-10-04 ENCOUNTER — Other Ambulatory Visit (HOSPITAL_COMMUNITY): Payer: Self-pay

## 2020-10-05 ENCOUNTER — Other Ambulatory Visit (HOSPITAL_COMMUNITY): Payer: Self-pay

## 2020-10-07 ENCOUNTER — Other Ambulatory Visit (HOSPITAL_COMMUNITY): Payer: Self-pay

## 2020-10-07 MED ORDER — ALBUTEROL SULFATE HFA 108 (90 BASE) MCG/ACT IN AERS
2.0000 | INHALATION_SPRAY | RESPIRATORY_TRACT | 2 refills | Status: DC | PRN
  Filled 2020-10-07: qty 18, 17d supply, fill #0
  Filled 2020-10-19: qty 18, 16d supply, fill #0
  Filled 2020-11-22: qty 18, 16d supply, fill #1
  Filled 2021-01-04: qty 18, 16d supply, fill #2

## 2020-10-08 ENCOUNTER — Other Ambulatory Visit (HOSPITAL_COMMUNITY): Payer: Self-pay

## 2020-10-08 MED ORDER — FLUTICASONE-SALMETEROL 250-50 MCG/ACT IN AEPB
1.0000 | INHALATION_SPRAY | Freq: Two times a day (BID) | RESPIRATORY_TRACT | 1 refills | Status: DC
  Filled 2020-10-08 – 2021-02-18 (×2): qty 60, 30d supply, fill #0
  Filled 2021-04-29: qty 60, 30d supply, fill #1

## 2020-10-11 DIAGNOSIS — F1020 Alcohol dependence, uncomplicated: Secondary | ICD-10-CM | POA: Diagnosis not present

## 2020-10-12 DIAGNOSIS — F1020 Alcohol dependence, uncomplicated: Secondary | ICD-10-CM | POA: Diagnosis not present

## 2020-10-13 DIAGNOSIS — F1020 Alcohol dependence, uncomplicated: Secondary | ICD-10-CM | POA: Diagnosis not present

## 2020-10-15 DIAGNOSIS — F1020 Alcohol dependence, uncomplicated: Secondary | ICD-10-CM | POA: Diagnosis not present

## 2020-10-18 ENCOUNTER — Other Ambulatory Visit (HOSPITAL_COMMUNITY): Payer: Self-pay

## 2020-10-18 DIAGNOSIS — F1020 Alcohol dependence, uncomplicated: Secondary | ICD-10-CM | POA: Diagnosis not present

## 2020-10-19 ENCOUNTER — Other Ambulatory Visit (HOSPITAL_COMMUNITY): Payer: Self-pay

## 2020-10-19 DIAGNOSIS — F1020 Alcohol dependence, uncomplicated: Secondary | ICD-10-CM | POA: Diagnosis not present

## 2020-10-20 ENCOUNTER — Other Ambulatory Visit (HOSPITAL_COMMUNITY): Payer: Self-pay

## 2020-10-20 DIAGNOSIS — F1020 Alcohol dependence, uncomplicated: Secondary | ICD-10-CM | POA: Diagnosis not present

## 2020-10-20 MED ORDER — FLUTICASONE-SALMETEROL 250-50 MCG/ACT IN AEPB
1.0000 | INHALATION_SPRAY | Freq: Two times a day (BID) | RESPIRATORY_TRACT | 1 refills | Status: AC
  Filled 2020-10-20 – 2020-11-01 (×2): qty 60, 30d supply, fill #0
  Filled 2021-01-04: qty 60, 30d supply, fill #1

## 2020-10-22 DIAGNOSIS — F1020 Alcohol dependence, uncomplicated: Secondary | ICD-10-CM | POA: Diagnosis not present

## 2020-10-25 ENCOUNTER — Other Ambulatory Visit (HOSPITAL_COMMUNITY): Payer: Self-pay

## 2020-10-28 ENCOUNTER — Other Ambulatory Visit (HOSPITAL_COMMUNITY): Payer: Self-pay

## 2020-11-01 ENCOUNTER — Other Ambulatory Visit (HOSPITAL_COMMUNITY): Payer: Self-pay

## 2020-11-22 ENCOUNTER — Other Ambulatory Visit (HOSPITAL_COMMUNITY): Payer: Self-pay

## 2021-01-04 ENCOUNTER — Other Ambulatory Visit (HOSPITAL_COMMUNITY): Payer: Self-pay

## 2021-02-18 ENCOUNTER — Other Ambulatory Visit (HOSPITAL_COMMUNITY): Payer: Self-pay

## 2021-02-18 MED ORDER — ALBUTEROL SULFATE HFA 108 (90 BASE) MCG/ACT IN AERS
2.0000 | INHALATION_SPRAY | RESPIRATORY_TRACT | 0 refills | Status: DC | PRN
  Filled 2021-02-18: qty 18, 16d supply, fill #0

## 2021-03-18 ENCOUNTER — Other Ambulatory Visit (HOSPITAL_COMMUNITY): Payer: Self-pay

## 2021-03-18 MED ORDER — ALBUTEROL SULFATE HFA 108 (90 BASE) MCG/ACT IN AERS
2.0000 | INHALATION_SPRAY | RESPIRATORY_TRACT | 1 refills | Status: DC | PRN
  Filled 2021-03-18: qty 18, 17d supply, fill #0
  Filled 2021-04-04: qty 18, 17d supply, fill #1

## 2021-04-04 ENCOUNTER — Other Ambulatory Visit (HOSPITAL_COMMUNITY): Payer: Self-pay

## 2021-04-29 ENCOUNTER — Other Ambulatory Visit (HOSPITAL_COMMUNITY): Payer: Self-pay

## 2021-04-29 MED ORDER — ALBUTEROL SULFATE HFA 108 (90 BASE) MCG/ACT IN AERS
2.0000 | INHALATION_SPRAY | RESPIRATORY_TRACT | 0 refills | Status: AC | PRN
  Filled 2021-04-29: qty 18, 17d supply, fill #0

## 2021-07-04 ENCOUNTER — Other Ambulatory Visit (HOSPITAL_COMMUNITY): Payer: Self-pay

## 2021-07-05 ENCOUNTER — Other Ambulatory Visit (HOSPITAL_COMMUNITY): Payer: Self-pay

## 2021-07-05 MED ORDER — FLUTICASONE-SALMETEROL 250-50 MCG/ACT IN AEPB
1.0000 | INHALATION_SPRAY | Freq: Two times a day (BID) | RESPIRATORY_TRACT | 0 refills | Status: DC
  Filled 2021-07-05: qty 60, 30d supply, fill #0

## 2021-07-06 ENCOUNTER — Other Ambulatory Visit (HOSPITAL_COMMUNITY): Payer: Self-pay

## 2021-07-06 MED ORDER — ALBUTEROL SULFATE HFA 108 (90 BASE) MCG/ACT IN AERS
2.0000 | INHALATION_SPRAY | RESPIRATORY_TRACT | 1 refills | Status: AC | PRN
  Filled 2021-07-06: qty 18, 17d supply, fill #0
  Filled 2021-08-19: qty 18, 17d supply, fill #1

## 2021-07-07 ENCOUNTER — Other Ambulatory Visit (HOSPITAL_COMMUNITY): Payer: Self-pay

## 2021-07-07 DIAGNOSIS — Z0000 Encounter for general adult medical examination without abnormal findings: Secondary | ICD-10-CM | POA: Diagnosis not present

## 2021-07-07 DIAGNOSIS — J45909 Unspecified asthma, uncomplicated: Secondary | ICD-10-CM | POA: Diagnosis not present

## 2021-07-07 DIAGNOSIS — J309 Allergic rhinitis, unspecified: Secondary | ICD-10-CM | POA: Diagnosis not present

## 2021-07-07 DIAGNOSIS — Z13220 Encounter for screening for lipoid disorders: Secondary | ICD-10-CM | POA: Diagnosis not present

## 2021-07-07 DIAGNOSIS — Z131 Encounter for screening for diabetes mellitus: Secondary | ICD-10-CM | POA: Diagnosis not present

## 2021-07-07 MED ORDER — ALBUTEROL SULFATE HFA 108 (90 BASE) MCG/ACT IN AERS
2.0000 | INHALATION_SPRAY | RESPIRATORY_TRACT | 1 refills | Status: DC | PRN
  Filled 2021-07-07 – 2021-09-15 (×2): qty 18, 17d supply, fill #0
  Filled 2021-11-08: qty 18, 17d supply, fill #1

## 2021-08-19 ENCOUNTER — Other Ambulatory Visit (HOSPITAL_COMMUNITY): Payer: Self-pay

## 2021-08-24 ENCOUNTER — Other Ambulatory Visit (HOSPITAL_COMMUNITY): Payer: Self-pay

## 2021-08-25 ENCOUNTER — Other Ambulatory Visit (HOSPITAL_COMMUNITY): Payer: Self-pay

## 2021-08-25 MED ORDER — FLUTICASONE-SALMETEROL 250-50 MCG/ACT IN AEPB
1.0000 | INHALATION_SPRAY | Freq: Two times a day (BID) | RESPIRATORY_TRACT | 3 refills | Status: AC
  Filled 2021-08-25 – 2021-09-15 (×2): qty 180, 90d supply, fill #0
  Filled 2021-12-16: qty 180, 90d supply, fill #1
  Filled 2022-05-26: qty 180, 90d supply, fill #2

## 2021-09-02 ENCOUNTER — Other Ambulatory Visit (HOSPITAL_COMMUNITY): Payer: Self-pay

## 2021-09-15 ENCOUNTER — Other Ambulatory Visit (HOSPITAL_COMMUNITY): Payer: Self-pay

## 2021-11-08 ENCOUNTER — Other Ambulatory Visit (HOSPITAL_COMMUNITY): Payer: Self-pay

## 2021-12-16 ENCOUNTER — Other Ambulatory Visit (HOSPITAL_COMMUNITY): Payer: Self-pay

## 2021-12-19 ENCOUNTER — Other Ambulatory Visit (HOSPITAL_COMMUNITY): Payer: Self-pay

## 2021-12-19 MED ORDER — ALBUTEROL SULFATE HFA 108 (90 BASE) MCG/ACT IN AERS
2.0000 | INHALATION_SPRAY | RESPIRATORY_TRACT | 1 refills | Status: DC | PRN
  Filled 2021-12-19: qty 18, 17d supply, fill #0
  Filled 2022-01-25: qty 18, 17d supply, fill #1

## 2022-01-25 ENCOUNTER — Other Ambulatory Visit (HOSPITAL_COMMUNITY): Payer: Self-pay

## 2022-01-25 MED ORDER — ALBUTEROL SULFATE HFA 108 (90 BASE) MCG/ACT IN AERS
2.0000 | INHALATION_SPRAY | RESPIRATORY_TRACT | 0 refills | Status: DC | PRN
  Filled 2022-01-25: qty 6.7, 17d supply, fill #0

## 2022-01-30 ENCOUNTER — Other Ambulatory Visit (HOSPITAL_COMMUNITY): Payer: Self-pay

## 2022-01-30 MED ORDER — ALBUTEROL SULFATE HFA 108 (90 BASE) MCG/ACT IN AERS
2.0000 | INHALATION_SPRAY | RESPIRATORY_TRACT | 6 refills | Status: AC | PRN
  Filled 2022-01-30: qty 6.7, 25d supply, fill #0
  Filled 2022-03-23: qty 6.7, 17d supply, fill #0
  Filled 2022-05-26: qty 6.7, 17d supply, fill #1

## 2022-02-01 ENCOUNTER — Other Ambulatory Visit (HOSPITAL_COMMUNITY): Payer: Self-pay

## 2022-03-23 ENCOUNTER — Other Ambulatory Visit (HOSPITAL_COMMUNITY): Payer: Self-pay

## 2022-05-26 ENCOUNTER — Other Ambulatory Visit (HOSPITAL_COMMUNITY): Payer: Self-pay

## 2022-07-17 ENCOUNTER — Other Ambulatory Visit (HOSPITAL_COMMUNITY): Payer: Self-pay

## 2022-07-17 MED ORDER — FLUTICASONE-SALMETEROL 250-50 MCG/ACT IN AEPB
1.0000 | INHALATION_SPRAY | Freq: Two times a day (BID) | RESPIRATORY_TRACT | 3 refills | Status: DC
  Filled 2022-07-17 – 2022-10-12 (×2): qty 180, 90d supply, fill #0
  Filled 2023-02-09: qty 180, 90d supply, fill #1

## 2022-07-17 MED ORDER — ALBUTEROL SULFATE HFA 108 (90 BASE) MCG/ACT IN AERS
2.0000 | INHALATION_SPRAY | RESPIRATORY_TRACT | 6 refills | Status: DC | PRN
  Filled 2022-07-17: qty 6.7, 25d supply, fill #0
  Filled 2022-10-12: qty 6.7, 25d supply, fill #1
  Filled 2022-11-02 – 2022-12-13 (×2): qty 6.7, 25d supply, fill #2
  Filled 2023-02-09: qty 6.7, 25d supply, fill #3
  Filled 2023-04-19 – 2023-04-27 (×2): qty 6.7, 25d supply, fill #4

## 2022-10-12 ENCOUNTER — Other Ambulatory Visit (HOSPITAL_COMMUNITY): Payer: Self-pay

## 2022-10-12 ENCOUNTER — Other Ambulatory Visit: Payer: Self-pay

## 2022-11-02 ENCOUNTER — Other Ambulatory Visit (HOSPITAL_COMMUNITY): Payer: Self-pay

## 2022-11-15 ENCOUNTER — Other Ambulatory Visit (HOSPITAL_COMMUNITY): Payer: Self-pay

## 2022-12-13 ENCOUNTER — Other Ambulatory Visit (HOSPITAL_COMMUNITY): Payer: Self-pay

## 2023-02-09 ENCOUNTER — Other Ambulatory Visit (HOSPITAL_COMMUNITY): Payer: Self-pay

## 2023-02-14 ENCOUNTER — Other Ambulatory Visit (HOSPITAL_COMMUNITY): Payer: Self-pay

## 2023-03-12 ENCOUNTER — Other Ambulatory Visit (HOSPITAL_COMMUNITY): Payer: Self-pay

## 2023-03-12 DIAGNOSIS — M25531 Pain in right wrist: Secondary | ICD-10-CM | POA: Diagnosis not present

## 2023-03-12 MED ORDER — DICLOFENAC SODIUM 50 MG PO TBEC
50.0000 mg | DELAYED_RELEASE_TABLET | Freq: Two times a day (BID) | ORAL | 0 refills | Status: AC
  Filled 2023-03-12: qty 40, 20d supply, fill #0

## 2023-03-13 ENCOUNTER — Other Ambulatory Visit: Payer: Self-pay | Admitting: Physician Assistant

## 2023-03-13 ENCOUNTER — Ambulatory Visit
Admission: RE | Admit: 2023-03-13 | Discharge: 2023-03-13 | Disposition: A | Discharge status: Home / Self Care | Payer: Commercial Managed Care - PPO | Source: Ambulatory Visit | Attending: Physician Assistant | Admitting: Physician Assistant

## 2023-03-13 DIAGNOSIS — M25531 Pain in right wrist: Secondary | ICD-10-CM

## 2023-04-19 ENCOUNTER — Other Ambulatory Visit (HOSPITAL_COMMUNITY): Payer: Self-pay

## 2023-04-19 ENCOUNTER — Other Ambulatory Visit: Payer: Self-pay

## 2023-04-27 ENCOUNTER — Other Ambulatory Visit (HOSPITAL_COMMUNITY): Payer: Self-pay

## 2023-07-09 ENCOUNTER — Other Ambulatory Visit (HOSPITAL_COMMUNITY): Payer: Self-pay

## 2023-10-15 ENCOUNTER — Other Ambulatory Visit (HOSPITAL_COMMUNITY): Payer: Self-pay

## 2023-10-15 MED ORDER — ALBUTEROL SULFATE HFA 108 (90 BASE) MCG/ACT IN AERS
2.0000 | INHALATION_SPRAY | RESPIRATORY_TRACT | 6 refills | Status: AC | PRN
  Filled 2023-10-15 – 2023-10-31 (×2): qty 6.7, 17d supply, fill #0
  Filled 2023-12-25: qty 6.7, 17d supply, fill #1
  Filled 2024-02-18: qty 6.7, 17d supply, fill #2
  Filled 2024-04-25: qty 6.7, 17d supply, fill #3

## 2023-10-30 ENCOUNTER — Other Ambulatory Visit (HOSPITAL_COMMUNITY): Payer: Self-pay

## 2023-10-31 ENCOUNTER — Encounter (HOSPITAL_COMMUNITY): Payer: Self-pay

## 2023-10-31 ENCOUNTER — Other Ambulatory Visit (HOSPITAL_COMMUNITY): Payer: Self-pay

## 2023-10-31 ENCOUNTER — Other Ambulatory Visit: Payer: Self-pay

## 2023-10-31 MED ORDER — FLUTICASONE-SALMETEROL 250-50 MCG/ACT IN AEPB
1.0000 | INHALATION_SPRAY | Freq: Two times a day (BID) | RESPIRATORY_TRACT | 0 refills | Status: AC
  Filled 2023-10-31 (×2): qty 60, 30d supply, fill #0

## 2023-11-01 ENCOUNTER — Other Ambulatory Visit: Payer: Self-pay

## 2023-11-02 ENCOUNTER — Other Ambulatory Visit (HOSPITAL_COMMUNITY): Payer: Self-pay

## 2023-11-02 ENCOUNTER — Other Ambulatory Visit (HOSPITAL_BASED_OUTPATIENT_CLINIC_OR_DEPARTMENT_OTHER): Payer: Self-pay

## 2023-11-02 MED ORDER — MONTELUKAST SODIUM 10 MG PO TABS
10.0000 mg | ORAL_TABLET | Freq: Every day | ORAL | 3 refills | Status: AC
  Filled 2023-11-02: qty 30, 30d supply, fill #0

## 2023-11-02 MED ORDER — FLUTICASONE-SALMETEROL 250-50 MCG/ACT IN AEPB
1.0000 | INHALATION_SPRAY | Freq: Two times a day (BID) | RESPIRATORY_TRACT | 0 refills | Status: AC
  Filled 2023-12-25: qty 60, 30d supply, fill #0
  Filled 2024-02-18: qty 60, 30d supply, fill #1
  Filled 2024-04-25: qty 60, 30d supply, fill #2

## 2023-11-13 ENCOUNTER — Other Ambulatory Visit (HOSPITAL_COMMUNITY): Payer: Self-pay

## 2023-12-25 ENCOUNTER — Other Ambulatory Visit (HOSPITAL_COMMUNITY): Payer: Self-pay

## 2024-02-18 ENCOUNTER — Other Ambulatory Visit (HOSPITAL_COMMUNITY): Payer: Self-pay

## 2024-04-25 ENCOUNTER — Other Ambulatory Visit (HOSPITAL_COMMUNITY): Payer: Self-pay
